# Patient Record
Sex: Female | Born: 1990 | Race: White | Hispanic: No | Marital: Married | Smoking: Never smoker
Health system: Southern US, Community
[De-identification: ages and names within clinical notes are randomized; demographics above are authoritative.]

## PROBLEM LIST (undated history)

## (undated) HISTORY — PX: CHOLECYSTECTOMY: SHX55

## (undated) HISTORY — PX: OOPHORECTOMY: SHX86

---

## 2013-09-27 DIAGNOSIS — F411 Generalized anxiety disorder: Secondary | ICD-10-CM | POA: Insufficient documentation

## 2015-03-08 ENCOUNTER — Ambulatory Visit: Payer: BLUE CROSS/BLUE SHIELD | Attending: Family Medicine | Admitting: Family Medicine

## 2015-03-08 ENCOUNTER — Encounter: Payer: Self-pay | Admitting: Family Medicine

## 2015-03-08 VITALS — BP 126/86 | HR 88 | Temp 98.2°F | Resp 20 | Ht 65.0 in | Wt 219.4 lb

## 2015-03-08 DIAGNOSIS — M25561 Pain in right knee: Secondary | ICD-10-CM | POA: Diagnosis present

## 2015-03-08 MED ORDER — TRAMADOL HCL 50 MG PO TABS
50.0000 mg | ORAL_TABLET | Freq: Three times a day (TID) | ORAL | Status: DC | PRN
Start: 2015-03-08 — End: 2015-04-09

## 2015-03-08 NOTE — Progress Notes (Signed)
Patient here for FU Knee pain to obtain MRI Referral  Patient complains of right knee pain scaled at an 8 currently. Patient states pain is constant and she has received no relief taking Tramadol and Meloxicam. Patient took a Vicodin she had from childbirth which gave her relief. Patient complains of right knee buckling when she walks. Patient had a fall on 03/03/15 which caused the initial pain. Patient states pain is now unbearable.   Patient would not like her flu shot today.

## 2015-03-08 NOTE — Patient Instructions (Addendum)
It was a pleasure to see you today.   As we discussed, I am referring you for evaluation by Sports Medicine for your right knee pain.    I am prescribing Tramadol 50mg  tablets, take 1 tablet every 8 hours as needed for pain.   Release of Information for X-ray reports/ED visits at [facility] and [facility].

## 2015-03-08 NOTE — Progress Notes (Addendum)
   Subjective:    Patient ID: Abigail Simpson, female    DOB: 1990-12-27, [age] y.o.   MRN: [id number]  HPI First visit in our practice, which is listed as her PCP on Medicaid card. History of knee pain/problems, R knee got worse 2 weeks ago when she presented to ED at [facility], had Xray and told she would likely need MRI.  Has had trouble ambulating since then, wearing a brace on R knee that she cannot wear while driving.   On 12/24 her dog ran into her accidentally and knocked her to floor, R knee buckled inward and was very painful.  She was on the floor and couldn't get up.  EMS was called. Family brought her to [facility], had Xrays and told again she would need orthopedic referral to be done by her primary provider.   ROS: No fevers or chills, no systemic illnesses. Never been diagnosed with HTN before, did have some PIH that resulted in induced delivery of her now-1169-month old daughter.  Review of Systems     Objective:   Physical Exam Alert, no distress.  Neck supple.  MSK: Knees without effusion or erythema or warmth. R knee with some crepitus with passive ROM testing. Pain to full flexion and full extension. Joint line tenderness along medial aspect of R knee.  Negative drawer sign testing R knee. Negative McMurray testing of right knee.  Bilateral palpable dp pulses. Sensation in both feet grossly normal and symmetric.       Assessment & Plan:  Right medial knee pain-- concern for possible meniscal or ACL injury. Referral for evaluation by Sports Medicine.    Prescription for tramadol 50mg  tablets,  1 tablet every 8 hours as needed for pain.   Release of Information for X-ray reports/ED visits at [facility] and [facility].  Follow up in this office after [facility] visit.

## 2015-03-14 ENCOUNTER — Encounter: Payer: Self-pay | Admitting: Family Medicine

## 2015-03-14 ENCOUNTER — Telehealth: Payer: Self-pay | Admitting: Family Medicine

## 2015-03-14 ENCOUNTER — Ambulatory Visit (INDEPENDENT_AMBULATORY_CARE_PROVIDER_SITE_OTHER): Payer: BLUE CROSS/BLUE SHIELD | Admitting: Family Medicine

## 2015-03-14 VITALS — BP 125/82 | HR 81 | Ht 65.0 in | Wt 210.0 lb

## 2015-03-14 DIAGNOSIS — M25561 Pain in right knee: Secondary | ICD-10-CM | POA: Diagnosis not present

## 2015-03-14 NOTE — Telephone Encounter (Signed)
Pt. Needs referral for MRI

## 2015-03-14 NOTE — Patient Instructions (Signed)
I'm concerned you tore your medial meniscus though it's possible you subluxed (near dislocated your patella). Wear knee brace for support. Icing 15 minutes at a time 3-4 times a day. Given your persistent problems I would recommend doing an MRI. Ibuprofen 600mg  three times a day with food OR aleve 2 tabs twice a day with food for pain and inflammation. Elevate above your heart when possible.

## 2015-03-15 ENCOUNTER — Telehealth: Payer: Self-pay | Admitting: Family Medicine

## 2015-03-15 NOTE — Assessment & Plan Note (Signed)
concerning for medial meniscus tear based on exam.  Brief MSK u/s of the knee shows visualized anterior meniscus is normal but most tears occur in the posterior meniscus that cannot be visualized by MSK u/s.  Effusion confirmed as well.  Recommended going ahead with MRI to further assess.  In meantime knee brace, icing, elevation, nsaids.

## 2015-03-15 NOTE — Addendum Note (Signed)
Addended by: Barbaraann BarthelBREEN, Addalynne Golding O on: 03/15/2015 02:06 PM   Modules accepted: Orders

## 2015-03-15 NOTE — Progress Notes (Signed)
PCP: [facility] Referred by Dr. Mauricio PoBreen.  Subjective:   HPI: Patient is a 25 y.o. female here for right knee injury.  Patient reports having problems with knees dating back to when she was a child - reports they were 'crooked and painful'  Then recently over past month she has had increased pain anterior right knee. On 12/24 her dog slid and struck her lateral aspect of right knee causing knee to buckle inwards. Pain level 7/10, sharp. Had radiographs negative for acute bony abnormalities [facility]. Patient endorses she was told the knee was dislocated but I don't see patellar dislocation on imaging and do not have access to her notes from that visit. Having popping with extension. Difficulty bearing weight. Tried brace, tramadol, meloxicam. No skin changes, fever. + swelling.  No past medical history on file.  Current Outpatient Prescriptions on File Prior to Visit  Medication Sig Dispense Refill  . ALPRAZolam (XANAX) 1 MG tablet Take 1 mg by mouth at bedtime as needed for anxiety.    . meloxicam (MOBIC) 15 MG tablet Take 15 mg by mouth daily.    . traMADol (ULTRAM) 50 MG tablet Take 1 tablet (50 mg total) by mouth every 8 (eight) hours as needed. 30 tablet 0   No current facility-administered medications on file prior to visit.    No past surgical history on file.  No Known Allergies  Social History   Social History  . Marital Status: Significant Other    Spouse Name: N/A  . Number of Children: N/A  . Years of Education: N/A   Occupational History  . Not on file.   Social History Main Topics  . Smoking status: Never Smoker   . Smokeless tobacco: Not on file  . Alcohol Use: No  . Drug Use: No  . Sexual Activity: Not on file   Other Topics Concern  . Not on file   Social History Narrative    No family history on file.  BP 125/82 mmHg  Pulse 81  Ht 5\' 5"  (1.651 m)  Wt 210 lb (95.255 kg)  BMI 34.95 kg/m2  Review of  Systems: See HPI above.    Objective:  Physical Exam:  Gen: NAD  Right knee: No gross deformity, ecchymoses.  Mild effusion confirmed by ultrasound TTP medial joint line. FROM. Negative ant/post drawers. Negative valgus/varus testing. Negative lachmanns. Positive mcmurrays, apleys, thessalys.  Pain with patellar apprehension. NV intact distally.  Left knee: FROM without pain currently.    Assessment & Plan:  1. Right knee injury - concerning for medial meniscus tear based on exam.  Brief MSK u/s of the knee shows visualized anterior meniscus is normal but most tears occur in the posterior meniscus that cannot be visualized by MSK u/s.  Effusion confirmed as well.  Recommended going ahead with MRI to further assess.  In meantime knee brace, icing, elevation, nsaids.

## 2015-03-27 ENCOUNTER — Ambulatory Visit (HOSPITAL_COMMUNITY)
Admission: RE | Admit: 2015-03-27 | Discharge: 2015-03-27 | Disposition: A | Discharge status: Home / Self Care | Payer: BLUE CROSS/BLUE SHIELD | Source: Ambulatory Visit | Attending: Family Medicine | Admitting: Family Medicine

## 2015-03-27 DIAGNOSIS — M238X1 Other internal derangements of right knee: Secondary | ICD-10-CM | POA: Diagnosis not present

## 2015-03-27 DIAGNOSIS — M25561 Pain in right knee: Secondary | ICD-10-CM | POA: Diagnosis not present

## 2015-03-27 DIAGNOSIS — M2241 Chondromalacia patellae, right knee: Secondary | ICD-10-CM | POA: Insufficient documentation

## 2015-03-28 ENCOUNTER — Telehealth: Payer: Self-pay

## 2015-03-28 NOTE — Telephone Encounter (Signed)
-----   Message from Barbaraann Barthel, MD sent at 03/28/2015  9:12 AM EST ----- Please notify patient that no ligament or mensical tears, no fractures seen on MRI.  "Bone bruising" and transient dislocation of patella.  Return to Sports Medicine for further evaluation and management if continuing with pain/problems.  Paula Compton, MD

## 2015-03-28 NOTE — Telephone Encounter (Signed)
Nurse called patient, patient verified date of birth. Patient aware of no ligament or meniscal tears, no fractures seen in MRI. Patient aware of "bone bruising" and transient dislocation of patella. Patient agrees to return to sports medicine for futher evaluation and management if continuing with problems and pain. Patient reports knee easily gives out and "brings me to my butt". Patient will call sports medicine for appointment.

## 2015-03-28 NOTE — Telephone Encounter (Signed)
Nurse called patient, reached voicemail. Left message for patient to call Matilde Pottenger with [facility], at [phone]. Nurse called to notify patient of no ligament or meniscal tears, no fractures seen on MRI. "Bone bruising" and transient dislocation of patella.  Return to Sports Medicine for further evaluation and management if continuing with pain/problems.

## 2015-04-09 ENCOUNTER — Ambulatory Visit (INDEPENDENT_AMBULATORY_CARE_PROVIDER_SITE_OTHER): Payer: BLUE CROSS/BLUE SHIELD | Admitting: Family Medicine

## 2015-04-09 ENCOUNTER — Ambulatory Visit (HOSPITAL_BASED_OUTPATIENT_CLINIC_OR_DEPARTMENT_OTHER)
Admission: RE | Admit: 2015-04-09 | Discharge: 2015-04-09 | Disposition: A | Discharge status: Home / Self Care | Payer: BLUE CROSS/BLUE SHIELD | Source: Ambulatory Visit | Attending: Family Medicine | Admitting: Family Medicine

## 2015-04-09 ENCOUNTER — Encounter: Payer: Self-pay | Admitting: Family Medicine

## 2015-04-09 VITALS — BP 132/89 | HR 91 | Ht 65.0 in | Wt 210.0 lb

## 2015-04-09 DIAGNOSIS — M25561 Pain in right knee: Secondary | ICD-10-CM

## 2015-04-09 DIAGNOSIS — M25461 Effusion, right knee: Secondary | ICD-10-CM | POA: Diagnosis not present

## 2015-04-09 MED ORDER — HYDROCODONE-ACETAMINOPHEN 7.5-325 MG PO TABS: 1.0000 | ORAL_TABLET | Freq: Four times a day (QID) | ORAL | Status: DC | PRN

## 2015-04-09 NOTE — Patient Instructions (Signed)
You suffered a patellar dislocation. Wear your immobilizer at all times when up and walking around - I would wear it to sleep as well as a precaution. Crutches to help get around. Icing 15 minutes at a time 3-4 times a day. Aleve 2 tabs twice a day with food for pain and inflammation. Norco as needed for severe pain (no driving on this medicine). Follow up with me in 2 weeks for reevaluation.

## 2015-04-11 NOTE — Assessment & Plan Note (Signed)
Independently reviewed todays radiograph and no avulsion, OCDs noted.  MRI from 1/17 prior to this injury consistent with patellar dislocation though MPFL was intact.  Has anatomy that predisposes her to subluxations and dislocations unfortunately.  Will start with conservative treatment - immobilizer, icing, nsaids with norco as needed.  F/u in 2 weeks for reevaluation.  Consider physical therapy at follow up and transition to shield's brace.

## 2015-04-11 NOTE — Progress Notes (Signed)
PCP: [facility] Referred by Dr. Mauricio Po.  Subjective:   HPI: Patient is a 25 y.o. female here for right knee injury.  1/4: Patient reports having problems with knees dating back to when she was a child - reports they were 'crooked and painful'  Then recently over past month she has had increased pain anterior right knee. On 12/24 her dog slid and struck her lateral aspect of right knee causing knee to buckle inwards. Pain level 7/10, sharp. Had radiographs negative for acute bony abnormalities at [facility]. Patient endorses she was told the knee was dislocated but I don't see patellar dislocation on imaging and do not have access to her notes from that visit. Having popping with extension. Difficulty bearing weight. Tried brace, tramadol, meloxicam. No skin changes, fever. + swelling.  1/30: Patient reports today she was doing a lunge and felt her right kneecap pop out of place laterally again. Extended and went back into place. Severe pain, swelling. Hasn't been wearing knee brace regularly. Has been taking tramadol as needed. Pain level 7/10 sharp and anterior. No skin changes, fever, other complaints.  No past medical history on file.  Current Outpatient Prescriptions on File Prior to Visit  Medication Sig Dispense Refill  . ALPRAZolam (XANAX) 1 MG tablet Take 1 mg by mouth at bedtime as needed for anxiety.     No current facility-administered medications on file prior to visit.    No past surgical history on file.  No Known Allergies  Social History   Social History  . Marital Status: Significant Other    Spouse Name: N/A  . Number of Children: N/A  . Years of Education: N/A   Occupational History  . Not on file.   Social History Main Topics  . Smoking status: Never Smoker   . Smokeless tobacco: Not on file  . Alcohol Use: No  . Drug Use: No  . Sexual Activity: Not on file   Other Topics Concern  . Not on file   Social  History Narrative    No family history on file.  BP 132/89 mmHg  Pulse 91  Ht  (1.651 m)  Wt 210 lb (95.255 kg)  BMI 34.95 kg/m2  LMP 03/26/2015 (Exact Date)  Review of Systems: See HPI above.    Objective:  Physical Exam:  Gen: NAD, uncomfortable.  Right knee: Mod effusion.  No bruising, other deformity. Diffuse anterior tenderness to palpation. ROM limited 0 - 80 degrees. Negative valgus/varus testing. Pain with patellar apprehension. NV intact distally.  Left knee: FROM without pain currently.    Assessment & Plan:  1. Right knee injury - Independently reviewed todays radiograph and no avulsion, OCDs noted.  MRI from 1/17 prior to this injury consistent with patellar dislocation though MPFL was intact.  Has anatomy that predisposes her to subluxations and dislocations unfortunately.  Will start with conservative treatment - immobilizer, icing, nsaids with norco as needed.  F/u in 2 weeks for reevaluation.  Consider physical therapy at follow up and transition to shield's brace.

## 2015-04-25 ENCOUNTER — Ambulatory Visit: Payer: BLUE CROSS/BLUE SHIELD | Admitting: Family Medicine

## 2015-04-25 ENCOUNTER — Encounter: Payer: Self-pay | Admitting: Family Medicine

## 2015-04-25 ENCOUNTER — Ambulatory Visit (INDEPENDENT_AMBULATORY_CARE_PROVIDER_SITE_OTHER): Payer: BLUE CROSS/BLUE SHIELD | Admitting: Family Medicine

## 2015-04-25 VITALS — BP 124/86 | HR 90 | Ht 65.0 in | Wt 200.0 lb

## 2015-04-25 DIAGNOSIS — M25561 Pain in right knee: Secondary | ICD-10-CM | POA: Diagnosis not present

## 2015-04-25 MED ORDER — TRAMADOL HCL 50 MG PO TABS: 50.0000 mg | ORAL_TABLET | Freq: Four times a day (QID) | ORAL | Status: AC | PRN

## 2015-04-25 NOTE — Patient Instructions (Addendum)
You suffered a patellar dislocation. Wear knee brace when up and walking around for next 4 weeks. Start physical therapy and do home exercises on days you don't go to therapy. Icing 15 minutes at a time 3-4 times a day. Aleve 2 tabs twice a day with food for pain and inflammation. Tramadol as needed for severe pain (no driving on this medicine). Follow up with me in 4 weeks for reevaluation.

## 2015-04-26 NOTE — Progress Notes (Signed)
PCP: [facility] Referred by Dr. Mauricio Po.  Subjective:   HPI: Patient is a 25 y.o. female here for right knee injury.  1/4: Patient reports having problems with knees dating back to when she was a child - reports they were 'crooked and painful'  Then recently over past month she has had increased pain anterior right knee. On 12/24 her dog slid and struck her lateral aspect of right knee causing knee to buckle inwards. Pain level 7/10, sharp. Had radiographs negative for acute bony abnormalities at [facility]. Patient endorses she was told the knee was dislocated but I don't see patellar dislocation on imaging and do not have access to her notes from that visit. Having popping with extension. Difficulty bearing weight. Tried brace, tramadol, meloxicam. No skin changes, fever. + swelling.  1/30: Patient reports today she was doing a lunge and felt her right kneecap pop out of place laterally again. Extended and went back into place. Severe pain, swelling. Hasn't been wearing knee brace regularly. Has been taking tramadol as needed. Pain level 7/10 sharp and anterior. No skin changes, fever, other complaints.  2/15: Patient reports her right knee pain is down to 4/10 level, sharp and anterior. Wearing brace all the time now. Some benefit with pain medicine. Swelling improved. No skin changes, fever.  No past medical history on file.  Current Outpatient Prescriptions on File Prior to Visit  Medication Sig Dispense Refill  . ALPRAZolam (XANAX) 1 MG tablet Take 1 mg by mouth at bedtime as needed for anxiety.     No current facility-administered medications on file prior to visit.    No past surgical history on file.  No Known Allergies  Social History   Social History  . Marital Status: Significant Other    Spouse Name: N/A  . Number of Children: N/A  . Years of Education: N/A   Occupational History  . Not on file.   Social History Main  Topics  . Smoking status: Never Smoker   . Smokeless tobacco: Not on file  . Alcohol Use: No  . Drug Use: No  . Sexual Activity: Not on file   Other Topics Concern  . Not on file   Social History Narrative    No family history on file.  BP 124/86 mmHg  Pulse 90  Ht  (1.651 m)  Wt 200 lb (90.719 kg)  BMI 33.28 kg/m2  PF 4 L/min  LMP 03/26/2015 (Exact Date)  Review of Systems: See HPI above.    Objective:  Physical Exam:  Gen: NAD, comfortable in exam room.  Right knee: Mild effusion.  No bruising, other deformity. Diffuse anterior tenderness to palpation. ROM 0 - 100 degrees. Negative valgus/varus testing. Pain with patellar apprehension. NV intact distally.  Left knee: FROM without pain currently.    Assessment & Plan:  1. Right patellar dislocation - MRI from 1/17 prior to this new injury from 2 weeks ago consistent with patellar dislocation though MPFL was intact.  Has anatomy that predisposes her to subluxations and dislocations unfortunately.  Has switched from immobilizer to knee brace.  Icing, nsaids with tramadol as needed.  Start physical therapy and home exercises, f/u in 4 weeks.

## 2015-04-26 NOTE — Assessment & Plan Note (Signed)
Right patellar dislocation - MRI from 1/17 prior to this new injury from 2 weeks ago consistent with patellar dislocation though MPFL was intact.  Has anatomy that predisposes her to subluxations and dislocations unfortunately.  Has switched from immobilizer to knee brace.  Icing, nsaids with tramadol as needed.  Start physical therapy and home exercises, f/u in 4 weeks.

## 2015-04-30 ENCOUNTER — Other Ambulatory Visit: Payer: Self-pay | Admitting: *Deleted

## 2015-04-30 ENCOUNTER — Telehealth: Payer: Self-pay | Admitting: Family Medicine

## 2015-04-30 MED ORDER — MELOXICAM 15 MG PO TABS: 15.0000 mg | ORAL_TABLET | Freq: Every day | ORAL | Status: AC

## 2015-04-30 NOTE — Telephone Encounter (Signed)
Spoke to patient and gave her information provided by physician. Patient wanted a prescription anti-inflammatory. Called in Meloxicam.

## 2015-04-30 NOTE — Telephone Encounter (Signed)
As we discussed would not refill the norco.  In addition to the tramadol she should be taking tylenol and an anti-inflammatory (aleve 2 tabs twice a day with food or ibuprofen  three times a day) - I can call in a prescription anti-inflammatory instead if she would like.

## 2015-05-09 ENCOUNTER — Ambulatory Visit
Payer: BLUE CROSS/BLUE SHIELD | Attending: Family Medicine | Admitting: Rehabilitative and Restorative Service Providers"

## 2015-05-09 ENCOUNTER — Encounter: Payer: Self-pay | Admitting: Rehabilitative and Restorative Service Providers"

## 2015-05-09 DIAGNOSIS — M25562 Pain in left knee: Secondary | ICD-10-CM | POA: Diagnosis present

## 2015-05-09 DIAGNOSIS — Z7409 Other reduced mobility: Secondary | ICD-10-CM | POA: Insufficient documentation

## 2015-05-09 DIAGNOSIS — R531 Weakness: Secondary | ICD-10-CM

## 2015-05-09 DIAGNOSIS — M25561 Pain in right knee: Secondary | ICD-10-CM | POA: Insufficient documentation

## 2015-05-09 DIAGNOSIS — R269 Unspecified abnormalities of gait and mobility: Secondary | ICD-10-CM

## 2015-05-09 DIAGNOSIS — M623 Immobility syndrome (paraplegic): Secondary | ICD-10-CM | POA: Diagnosis present

## 2015-05-09 DIAGNOSIS — M2560 Stiffness of unspecified joint, not elsewhere classified: Secondary | ICD-10-CM

## 2015-05-09 NOTE — Therapy (Signed)
[facility] Outpatient Rehabilitation [facility] [address]  [address] [place], [place], [address] Phone: [phone]   Fax:  [phone]  Physical Therapy Evaluation  Patient Details  Name: Abigail Simpson MRN: [id number] Date of Birth: November 26, 1990 Referring Provider: Dr. Norton Blizzard  Encounter Date: 05/09/2015      PT End of Session - 05/09/15 1356    Visit Number 1   Number of Visits 12   Date for PT Re-Evaluation 06/20/15   PT Start Time 1356   PT Stop Time 1500   PT Time Calculation (min) 64 min   Activity Tolerance Patient tolerated treatment well      History reviewed. No pertinent past medical history.  History reviewed. No pertinent past surgical history.  There were no vitals filed for this visit.  Visit Diagnosis:  Knee pain, acute, right - Plan: PT plan of care cert/re-cert  Knee pain, acute, left - Plan: PT plan of care cert/re-cert  Abnormal gait - Plan: PT plan of care cert/re-cert  Stiffness due to immobility - Plan: PT plan of care cert/re-cert  Decreased strength, endurance, and mobility - Plan: PT plan of care cert/re-cert      Subjective Assessment - 05/09/15 1356    Subjective Princessa reports that she dislocated the Rt knee cap 03/03/15 and then again 03/30/15. She feels weak and unstable without her brace. She removed the brace today and states that she feels the knee brace is "digging in to the leg" now.    Pertinent History denies any medical problems   How long can you sit comfortably? siting with knee bent and feet unsupported ~5 min    How long can you stand comfortably? 2 min    How long can you walk comfortably? 7 min    Diagnostic tests xrays (-)    Patient Stated Goals strengthen knees    Currently in Pain? Yes   Pain Score 3    Pain Location Knee   Pain Orientation Right   Pain Descriptors / Indicators Burning   Pain Type Acute pain   Pain Onset More than a month ago   Pain Frequency Intermittent   Aggravating  Factors  prolonged sitting, standing, walking; turning knee any way; sudden movements; squatting    Pain Relieving Factors avoiding activities that irritate symptoms; ice and heat made knee "almost feel worse"             [facility] PT Assessment - 05/09/15 0001    Assessment   Medical Diagnosis Rt knee pain    Referring Provider Dr. Vincenza Hews hudnall   Onset Date/Surgical Date 03/03/15   Hand Dominance Right   Next MD Visit PRN    Prior Therapy none   Precautions   Precautions None   Balance Screen   Has the patient fallen in the past 6 months Yes   How many times? 2   Has the patient had a decrease in activity level because of a fear of falling?  No   Is the patient reluctant to leave their home because of a fear of falling?  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    Leisure child care; household chores; Pharmacologist; cooking   Observation/Other Assessments   Focus on Therapeutic  Outcomes (FOTO)  53% limitation    Sensation   Additional Comments WFL's per pt report    Posture/Postural Control   Posture Comments Head forward; shouders rounded and elevated; increased thoracic kyphosis; wt shifted to the Lt; Rt knee slightly bent; Lt knee hyperextended    AROM   Right/Left Hip --  WFL's Lt - some end range tightness Rt rotation    Right Knee Extension -9   Right Knee Flexion 123   Left Knee Extension 6   Left Knee Flexion 131   Right/Left Ankle --  WFL's   Strength   Right Hip Flexion --  5-/5   Right Hip Extension 4+/5   Right Hip ABduction --  5-/5   Right Hip ADduction 4+/5   Right Knee Flexion 4+/5   Right Knee Extension 4+/5   Left Knee Flexion --  5-/5   Left Knee Extension 5/5   Right/Left Ankle --  WFL's   Flexibility   Hamstrings tight Rt at 75 deg with limitatioin  noted in full knee extension; HS tightness Lt at ~ 80 deg    Quadriceps heel to buttock in prone Rt ~3.5 inches; Lt 2 inches   ITB mild tightness Rt > Lt    Palpation   Patella mobility Rt patella laterally oriented    Palpation comment tightness through lateral quad just proximal to the patella; tender to palpation distal medial patellar area    Ambulation/Gait   Gait Comments ambulates with decreased wt bearing and wt shift to Rt; Rt LE ER in standing and walking                    [facility] Adult PT Treatment/Exercise - 05/09/15 0001    Neuro Re-ed    Neuro Re-ed Details  standing with knees straight; avoiding hyperextension on Lt and excessive flexion on the Rt   Knee/Hip Exercises: Stretches   Passive Hamstring Stretch 3 reps;30 seconds   Knee/Hip Exercises: Supine   Quad Sets Right;Left;1 set;10 reps  10 sec hold    Hip Adduction Isometric 1 set;10 reps  10 sec hold ball between knees    Straight Leg Raise with External Rotation Right;1 set;10 reps  10-12 inch lift; 5 sec hold    Manual Therapy   Soft tissue mobilization deep tissue work lateral quad and lateral to medial stretch for Rt patella   [employer];Inhibit Muscle;Facilitate Muscle  Rt patella realignmentcorrecting lat position; Lt supportive   [employer]   Barista on Rt    Inhibit Muscle  inhibit lateral quad on Rt    Facilitate Muscle  facilitate quads Lt knee                 PT Education - 05/09/15 1455    Education provided Yes   Education Details HEP; care for tape - to remove tape if there is any irritation or problems   Person(s) Educated Patient   Methods Explanation;Demonstration;Tactile cues;Verbal cues;Handout   Comprehension Verbalized understanding;Returned demonstration;Verbal cues required;Tactile cues required             PT Long Term Goals - 05/09/15 1517    PT LONG TERM GOAL #1   Title Improve posture and alignment with pt to demo  good standing posture without knees hyperextended or flexed 06/20/15   Time 6   Period Weeks   Status New   PT LONG TERM GOAL #2   Title improve gait pattern and tolerance with pt to  ambulate without limp for 20-30 min with minimal to no pain 06/20/15   Time 6   Period Weeks   Status New   PT LONG TERM GOAL #3   Title 5/5 strength bilat LE's to allow pt to perform functional tasks without limitations 06/20/15   Time 6   Period Weeks   Status New   PT LONG TERM GOAL #4   Title patient will be able to ascend and descend stairs step over step in home without difficulty or rfear of falling 06/20/15   Time 6   Period Weeks   Status New   PT LONG TERM GOAL #5   Title I in HEP to prevent recurrent problems with knee instability 06/20/15   Time 6   Period Weeks   Status New   PT LONG TERM GOAL #6   Title Improve FOTO to </= 35% limitation 06/20/15   Time 6   Period Weeks   Status New               Plan - 05/09/15 1512    Clinical Impression Statement Patient presents with bilat knee instability and pain Rt >> Lt with two incidents of subluxation of the Rt patella. She has been in a knee brace for 2+ months stating that she took the brace off today because it seems to be irritating the symptoms at this time. Clinically pt has abnormal posture andalignment; abnormal and antalgic gait; limited Rt knee ROM compared to Lt; weakness bilat LE's; fear of falling; inability to perform normal functional activities due to above. Pt will benefit form PT to address problems as identified.    Pt will benefit from skilled therapeutic intervention in order to improve on the following deficits Postural dysfunction;Improper body mechanics;Decreased range of motion;Decreased strength;Abnormal gait;Pain;Decreased endurance;Decreased activity tolerance   Rehab Potential Good   PT Frequency 2x / week   PT Duration 6 weeks   PT Treatment/Interventions Patient/family education;ADLs/Self Care Home  Management;Neuromuscular re-education;Therapeutic exercise;Therapeutic activities;Manual techniques;Dry needling;Cryotherapy;Electrical Stimulation;Moist Heat;Ultrasound   PT Next Visit Plan assess response to taping and initial exercise; progress with strengthening bilat LE's - hips/knees/ankles - progressing to stabilization and functional activities; modalities as indicated   PT Home Exercise Plan HEP; taping bilat knees    Consulted and Agree with Plan of Care Patient         Problem List Patient Active Problem List   Diagnosis Date Noted  . Right medial knee pain 03/08/2015  . Anxiety, generalized 09/27/2013    Roseline Ebarb Rober Minion PT, MPH  05/09/2015, 3:28 PM  [facility] [address]  [address] [place], [place], [address] Phone: [phone]   Fax:  [phone]  Name: Matylda Fehring MRN: [id number] Date of Birth: May 17, 1990

## 2015-05-09 NOTE — Patient Instructions (Signed)
HIP: Hamstrings - Supine    Place strap around foot. Keep knee very straight. Raise leg up, keep knee straight. Hold _30_ seconds. _3__ reps per set, __2-3_ times per day  Quad Set: Slight Flexion    Tense muscles on top of left thigh. Hold __10__ seconds. Repeat __10__ times per set. Do __1-3__ sets per session. Do __2-3__ sessions per day.   Straight Leg: with Bent Knee (Supine)    With right leg straight; turn toes out about 45 degrees, left leg bent, tighten quads and raise straight leg __10-12__ inches. Repeat _10___ times per set. Do _1-3___ sets per session. Do __1-2__ sessions per day.   Strengthening: Hip Adduction - Isometric    With ball or folded pillow between knees, squeeze knees together. Hold __10__ seconds. Repeat __10__ times per set. Do __1-3__ sets per session. Do __1=2__ sessions per day.   Self-Mobilization: Inward Kneecap Push    With entire length of index finger along outer border of right kneecap, gently push kneecap in toward other leg. Hold 20-30____ seconds. Repeat __2-3__ times per set. Do __2-3__ sessions per day.       Marland Kitchen

## 2015-05-16 ENCOUNTER — Ambulatory Visit: Payer: BLUE CROSS/BLUE SHIELD

## 2015-05-16 DIAGNOSIS — M25561 Pain in right knee: Secondary | ICD-10-CM | POA: Diagnosis not present

## 2015-05-16 DIAGNOSIS — M2560 Stiffness of unspecified joint, not elsewhere classified: Secondary | ICD-10-CM

## 2015-05-16 DIAGNOSIS — M25562 Pain in left knee: Secondary | ICD-10-CM

## 2015-05-16 DIAGNOSIS — R531 Weakness: Secondary | ICD-10-CM

## 2015-05-16 DIAGNOSIS — Z7409 Other reduced mobility: Secondary | ICD-10-CM

## 2015-05-16 DIAGNOSIS — R6889 Other general symptoms and signs: Secondary | ICD-10-CM

## 2015-05-16 DIAGNOSIS — R269 Unspecified abnormalities of gait and mobility: Secondary | ICD-10-CM

## 2015-05-16 NOTE — Therapy (Signed)
[facility] Outpatient Rehabilitation [facility] [address]  [address] [place], [place], [address] Phone: [phone]   Fax:  [phone]  Physical Therapy Treatment  Patient Details  Name: Abigail Simpson MRN: [id number] Date of Birth: 09-20-1990 Referring Provider: Dr. Norton Blizzard  Encounter Date: 05/16/2015      PT End of Session - 05/16/15 1532    Visit Number 2   Number of Visits 12   Date for PT Re-Evaluation 06/20/15   PT Start Time 1532   PT Stop Time 1615   PT Time Calculation (min) 43 min   Activity Tolerance Patient tolerated treatment well   Behavior During Therapy [facility] for tasks assessed/performed      History reviewed. No pertinent past medical history.  History reviewed. No pertinent past surgical history.  There were no vitals filed for this visit.  Visit Diagnosis:  Knee pain, acute, right  Knee pain, acute, left  Abnormal gait  Stiffness due to immobility  Decreased strength, endurance, and mobility      Subjective Assessment - 05/16/15 1532    Subjective Pt. reports 2/10 R knee pain currenly.  No pain or other complaints reported.  Pt. reports taping helped from last treatment.     Pertinent History denies any medical problems   How long can you sit comfortably? siting with knee bent and feet unsupported ~5 min    How long can you stand comfortably? 2 min    How long can you walk comfortably? 7 min    Diagnostic tests xrays (-)    Patient Stated Goals strengthen knees    Currently in Pain? Yes   Pain Score 2    Pain Location Knee   Pain Orientation Right   Pain Descriptors / Indicators Burning   Pain Type Acute pain   Pain Onset More than a month ago   Pain Frequency Intermittent   Aggravating Factors  sudden movements; squatting   Pain Relieving Factors heat and ice make knee "feel worse".   Multiple Pain Sites No     Today's treatment:  Therex: Bridging with adduction squeeze 2 x 10 reps  Bridging with B hip  ER with green band x 10 reps Bridging with isometric / ER with green TB around knees 4 x 5 reps R HS stretch 2 x 30 sec  Straight leg raise with External rotation x 10 reps with 12" raise to 5 sec hold Straight leg raise with External rotation x 10 reps with 12" raise with #2 cuff weight on ankle   Manual: Lateral > medial mobilizations of R patella   [employer]: R lateral patella support: - I strip to lateral patella (30 % stretch) - I strip to medial patella (30 % stretch) - Cross strip super to patella (30% stretch) - Cross strip just inferior to patella (30% stretch)        PT Long Term Goals - 05/17/15 0815    PT LONG TERM GOAL #1   Title Improve posture and alignment with pt to demo good standing posture without knees hyperextended or flexed 06/20/15   Time 6   Period Weeks   Status On-going   PT LONG TERM GOAL #2   Title improve gait pattern and tolerance with pt to ambulate without limp for 20-30 min with minimal to no pain 06/20/15   Time 6   Period Weeks   Status On-going   PT LONG TERM GOAL #3   Title 5/5 strength bilat LE's to allow  pt to perform functional tasks without limitations 06/20/15   Time 6   Period Weeks   Status On-going   PT LONG TERM GOAL #4   Title patient will be able to ascend and descend stairs step over step in home without difficulty or rfear of falling 06/20/15   Time 6   Period Weeks   Status On-going   PT LONG TERM GOAL #5   Title I in HEP to prevent recurrent problems with knee instability 06/20/15   Time 6   Period Weeks   Status On-going   PT LONG TERM GOAL #6   Title Improve FOTO to </= 35% limitation 06/20/15   Time 6   Period Weeks   Status On-going          Plan - 05/16/15 1533    Clinical Impression Statement Pt. tolerated all R LE strengthening and stretching well with no increase in R knee pain.  Pt. reported taping from last treatment helped pain; taping reapplied to lateral patella at beginning of treatment today.  Pt.  reports adherence to all HEP activities with exception of patellar mobilizations.  Today's treatment focused on strengthening R VMO, B hips and improving R LE flexibiliy.     PT Next Visit Plan assess response to taping and initial exercise; progress with strengthening bilat LE's - hips/knees/ankles - progressing to stabilization and functional activities; modalities as indicated      Problem List Patient Active Problem List   Diagnosis Date Noted  . Right medial knee pain 03/08/2015  . Anxiety, generalized 09/27/2013    Kermit BaloMicah Jaymian Bogart, PTA 05/17/2015, 8:15 AM  [facility] [address]  [address] [place], [place], [address] Phone: [phone]   Fax:  [phone]  Name: Abigail Simpson MRN: [id number] Date of Birth: 1990/03/13

## 2015-05-23 ENCOUNTER — Ambulatory Visit: Payer: BLUE CROSS/BLUE SHIELD

## 2015-05-29 ENCOUNTER — Ambulatory Visit: Payer: BLUE CROSS/BLUE SHIELD | Admitting: Physical Therapy

## 2015-05-29 DIAGNOSIS — M25561 Pain in right knee: Secondary | ICD-10-CM

## 2015-05-29 DIAGNOSIS — M2560 Stiffness of unspecified joint, not elsewhere classified: Secondary | ICD-10-CM

## 2015-05-29 DIAGNOSIS — R6889 Other general symptoms and signs: Secondary | ICD-10-CM

## 2015-05-29 DIAGNOSIS — Z7409 Other reduced mobility: Secondary | ICD-10-CM

## 2015-05-29 DIAGNOSIS — R531 Weakness: Secondary | ICD-10-CM

## 2015-05-29 DIAGNOSIS — M25562 Pain in left knee: Secondary | ICD-10-CM

## 2015-05-29 DIAGNOSIS — R269 Unspecified abnormalities of gait and mobility: Secondary | ICD-10-CM

## 2015-05-29 NOTE — Telephone Encounter (Signed)
Finished

## 2015-05-29 NOTE — Therapy (Addendum)
[facility] [address]  [place], [place], [address] Phone: [phone]   Fax:  [phone]  Physical Therapy Treatment  Patient Details  Name: Abigail Simpson MRN: [id number] Date of Birth: Jul 08, 1990 Referring Provider: Dr. Karlton Lemon  Encounter Date: 05/29/2015      PT End of Session - 05/29/15 1324    Visit Number 3   Number of Visits 12   Date for PT Re-Evaluation 06/20/15   PT Start Time 6333   PT Stop Time 1400   PT Time Calculation (min) 45 min   Activity Tolerance Patient tolerated treatment well   Behavior During Therapy [facility] for tasks assessed/performed      No past medical history on file.  No past surgical history on file.  There were no vitals filed for this visit.  Visit Diagnosis:  Knee pain, acute, right  Knee pain, acute, left  Abnormal gait  Stiffness due to immobility  Decreased strength, endurance, and mobility      Subjective Assessment - 05/29/15 1322    Subjective Pt reports limited compliance with HEP but trying to get better about it recently. Notes pain increased a few days ago without known specific trigger but thinks it may have been that she was trying to do more.   Currently in Pain? Yes   Pain Score --  3-4/10   Pain Location Knee   Pain Orientation Right          Today's treatment  TherEx NuStep - L4 x 4' R HS stretch with strap 2 x 30" Bridge + B Hip ADD squeeze on small ball 10x5" Bridge + B Hip ABD isometric with blue TB 10x5" Sustained Bridge + Alternating B hip ABD/ER with blue TB 4x5 L side-lying R Hip ABD/ER Clam 10x3" Bridge + HS curl with feet on orange (55cm) Pball x10 B Alternating SL Bridge 10x3" R ITB stretch manual & with strap 2 x 30"   Manual R Medial patellar glide  Manual R ITB stretch with medial patellar glide  Kinesiotape: R lateral patella support (3 strips)   2 "I" strips to medial (30%) & lateral (50%) patella meeting at  tibial tuberosity and distal quad   1 "I" with 50% pull lateral to medial over inferior patella         PT Long Term Goals - 05/29/15 1451    PT LONG TERM GOAL #1   Title Improve posture and alignment with pt to demo good standing posture without knees hyperextended or flexed 06/20/15   Time 6   Period Weeks   Status On-going   PT LONG TERM GOAL #2   Title improve gait pattern and tolerance with pt to ambulate without limp for 20-30 min with minimal to no pain 06/20/15   Time 6   Period Weeks   Status On-going   PT LONG TERM GOAL #3   Title 5/5 strength bilat LE's to allow pt to perform functional tasks without limitations 06/20/15   Time 6   Period Weeks   Status On-going   PT LONG TERM GOAL #4   Title patient will be able to ascend and descend stairs step over step in home without difficulty or rfear of falling 06/20/15   Time 6   Period Weeks   Status On-going   PT LONG TERM GOAL #5   Title I in HEP to prevent recurrent problems with knee instability 06/20/15   Time 6   Period Weeks  Status On-going   PT LONG TERM GOAL #6   Title Improve FOTO to </= 35% limitation 06/20/15   Time 6   Period Weeks   Status On-going               Plan - 05/29/15 1448    Clinical Impression Statement Pt reporting limited compliance with HEP but trying to get on a more consistent schedule. Able to progress resistance and intensity of supine exercises today with increased muscle effort noted but no increased pain. Will plan to work toward more upright CKC activities at next visit.   PT Next Visit Plan Progress with strengthening bilat LE's - hips/knees/ankles - progressing to stabilization and functional activities; modalities as indicated   Consulted and Agree with Plan of Care Patient        Problem List Patient Active Problem List   Diagnosis Date Noted  . Right medial knee pain 03/08/2015  . Anxiety, generalized 09/27/2013    Percival Spanish, PT, MPT 05/29/2015, 2:52  PM  [facility] [address]  [place], [place], [address] Phone: [phone]   Fax:  [phone]  Name: Gavyn Zoss MRN: [id number] Date of Birth: 1990/04/16       PHYSICAL THERAPY DISCHARGE SUMMARY  Visits from Start of Care: 3  Current functional level related to goals / functional outcomes: See above   Remaining deficits: Unknown as pt did not return; pt did not show for last appt and did not return phone call to pt.   Education / Equipment: HEP  Plan: Patient agrees to discharge.  Patient goals were not met. Patient is being discharged due to not returning since the last visit.  ?????    Laureen Abrahams, PT, DPT 07/05/2015 1:33 PM  [facility] [place], [place] [address]  [phone] (office) [phone] (fax)

## 2015-05-30 ENCOUNTER — Ambulatory Visit: Payer: BLUE CROSS/BLUE SHIELD

## 2015-06-05 ENCOUNTER — Ambulatory Visit: Payer: BLUE CROSS/BLUE SHIELD

## 2015-06-26 ENCOUNTER — Other Ambulatory Visit: Payer: Self-pay | Admitting: Family Medicine

## 2015-09-16 ENCOUNTER — Emergency Department (HOSPITAL_BASED_OUTPATIENT_CLINIC_OR_DEPARTMENT_OTHER): Payer: BLUE CROSS/BLUE SHIELD

## 2015-09-16 ENCOUNTER — Encounter (HOSPITAL_BASED_OUTPATIENT_CLINIC_OR_DEPARTMENT_OTHER): Payer: Self-pay | Admitting: Emergency Medicine

## 2015-09-16 ENCOUNTER — Emergency Department (HOSPITAL_BASED_OUTPATIENT_CLINIC_OR_DEPARTMENT_OTHER)
Admission: EM | Admit: 2015-09-16 | Discharge: 2015-09-16 | Disposition: A | Discharge status: Home / Self Care | Payer: BLUE CROSS/BLUE SHIELD | Attending: Emergency Medicine | Admitting: Emergency Medicine

## 2015-09-16 DIAGNOSIS — N939 Abnormal uterine and vaginal bleeding, unspecified: Secondary | ICD-10-CM | POA: Insufficient documentation

## 2015-09-16 DIAGNOSIS — R1032 Left lower quadrant pain: Secondary | ICD-10-CM

## 2015-09-16 DIAGNOSIS — R102 Pelvic and perineal pain: Secondary | ICD-10-CM

## 2015-09-16 LAB — CBC WITH DIFFERENTIAL/PLATELET
Basophils Absolute: 0 10*3/uL (ref 0.0–0.1)
Basophils Relative: 0 %
EOS ABS: 0.1 10*3/uL (ref 0.0–0.7)
Eosinophils Relative: 1 %
HCT: 43.4 % (ref 36.0–46.0)
HEMOGLOBIN: 15 g/dL (ref 12.0–15.0)
LYMPHS ABS: 2.3 10*3/uL (ref 0.7–4.0)
LYMPHS PCT: 19 %
MCH: 30.7 pg (ref 26.0–34.0)
MCHC: 34.6 g/dL (ref 30.0–36.0)
MCV: 88.9 fL (ref 78.0–100.0)
Monocytes Absolute: 0.6 10*3/uL (ref 0.1–1.0)
Monocytes Relative: 5 %
NEUTROS PCT: 75 %
Neutro Abs: 9.2 10*3/uL — ABNORMAL HIGH (ref 1.7–7.7)
Platelets: 243 10*3/uL (ref 150–400)
RBC: 4.88 MIL/uL (ref 3.87–5.11)
RDW: 14.2 % (ref 11.5–15.5)
WBC: 12.3 10*3/uL — AB (ref 4.0–10.5)

## 2015-09-16 LAB — PREGNANCY, URINE: Preg Test, Ur: NEGATIVE

## 2015-09-16 MED ORDER — HYDROCODONE-ACETAMINOPHEN 5-325 MG PO TABS: 1.0000 | ORAL_TABLET | ORAL | Status: AC | PRN

## 2015-09-16 MED ORDER — ALPRAZOLAM 0.5 MG PO TABS
0.5000 mg | ORAL_TABLET | Freq: Once | ORAL | Status: AC
  Administered 2015-09-16: 0.5 mg via ORAL
  Filled 2015-09-16: qty 1

## 2015-09-16 NOTE — ED Notes (Signed)
Pt in c/o L pelvic pain and vaginal bleeding onset 2 days ago, hx of R oophorectomy. Ambulatory in NAD.

## 2015-09-16 NOTE — ED Notes (Signed)
Pt states left lower "internal" quadrant pain, unchanged with palpation, and abnormal vaginal bleeding for past 4 days off and on.  Pt states she had intercourse on July 2 and bleeding started immediately after that.

## 2015-09-16 NOTE — ED Provider Notes (Signed)
CSN: [id number]     Arrival date & time 09/16/15  1432 History  By signing my name below, I, Doreatha Martin, attest that this documentation has been prepared under the direction and in the presence of Marily Memos, MD. Electronically Signed: Doreatha Martin, ED Scribe. 09/16/2015. 3:23 PM.    Chief Complaint  Patient presents with  . Pelvic Pain   The history is provided by the patient. No language interpreter was used.    HPI Comments: Abigail Simpson is a 25 y.o. female who presents to the Emergency Department complaining of moderate LLQ cramping onset yesterday with associated abnormal vaginal bleeding onset last night. Per pt, she has changed 3 tampons and the bleeding has progressively gotten heavier since onset. Pt states her bleeding has not contained any clots. She is not on oral contraceptives and has never been on oral contraceptives. Pt is not actively trying to conceive. LNMP ended on 09/08/15. Pt states she was referred to the ED after calling her OB/GYN last night. She states that she had unprotected intercourse on 09/09/15 before the onset of her symptoms and is concerned for pregnancy. Pt has h/o right oophorectomy d/t cyst. She denies fever, nausea, emesis, diarrhea, constipation, rash, pallor, vaginal discharge or odor.   History reviewed. No pertinent past medical history. Past Surgical History  Procedure Laterality Date  . Oophorectomy     History reviewed. No pertinent family history. Social History  Substance Use Topics  . Smoking status: Never Smoker   . Smokeless tobacco: None  . Alcohol Use: No   OB History    No data available     Review of Systems  Constitutional: Negative for fever.  Gastrointestinal: Positive for abdominal pain. Negative for nausea, vomiting, diarrhea and constipation.  Genitourinary: Positive for vaginal bleeding. Negative for vaginal discharge.  Skin: Negative for pallor and rash.  All other systems reviewed and are negative.  Allergies  Review of  patient's allergies indicates no known allergies.  Home Medications   Prior to Admission medications   Medication Sig Start Date End Date Taking? Authorizing Provider  ALPRAZolam Prudy Feeler) 1 MG tablet Take 1 mg by mouth at bedtime as needed for anxiety.    Historical Provider, MD  HYDROcodone-acetaminophen (NORCO/VICODIN) 5-325 MG tablet Take 1 tablet by mouth every 4 (four) hours as needed. 09/16/15   Marily Memos, MD  meloxicam (MOBIC) 15 MG tablet Take 1 tablet (15 mg total) by mouth daily. Patient not taking: Reported on 05/09/2015 04/30/15   Lenda Kelp, MD  traMADol (ULTRAM) 50 MG tablet Take 1 tablet (50 mg total) by mouth every 6 (six) hours as needed. 04/25/15   Lenda Kelp, MD   BP 125/88 mmHg  Pulse 80  Temp(Src) 98.8 F (37.1 C) (Oral)  Resp 20  Ht  (1.651 m)  Wt 180 lb (81.647 kg)  BMI 29.95 kg/m2  SpO2 100%  LMP 09/04/2015 Physical Exam  Constitutional: She appears well-developed and well-nourished.  HENT:  Head: Normocephalic.  Eyes: Conjunctivae are normal.  Cardiovascular: Normal rate.   Pulmonary/Chest: Effort normal. No respiratory distress.  Abdominal: Soft. She exhibits no distension and no mass. There is no tenderness. There is no rebound and no guarding.  Genitourinary:  Normal external female genitalia. Bright red blood in vaginal vault. No evidence of laceration. Small amount of oozing from os. On bimanual exam no adnexal tenderness or masses bilaterally. Chaperone present throughout entire exam.    Musculoskeletal: Normal range of motion.  Neurological: She is alert.  Skin: Skin is warm and dry.  Psychiatric: She has a normal mood and affect. Her behavior is normal.  Nursing note and vitals reviewed.   ED Course  Procedures (including critical care time) DIAGNOSTIC STUDIES: Oxygen Saturation is 100% on RA, normal by my interpretation.    COORDINATION OF CARE: 3:21 PM Discussed treatment plan with pt at bedside which includes lab work and pt  agreed to plan.   Labs Review Labs Reviewed  CBC WITH DIFFERENTIAL/PLATELET - Abnormal; Notable for the following:    WBC 12.3 (*)    Neutro Abs 9.2 (*)    All other components within normal limits  PREGNANCY, URINE    Imaging Review [place] Transvaginal Non-ob  09/16/2015  CLINICAL DATA:  Acute onset of left lower quadrant abdominal pain and vaginal bleeding. Initial encounter. EXAM: TRANSABDOMINAL AND TRANSVAGINAL ULTRASOUND OF PELVIS DOPPLER ULTRASOUND OF OVARIES TECHNIQUE: Both transabdominal and transvaginal ultrasound examinations of the pelvis were performed. Transabdominal technique was performed for global imaging of the pelvis including uterus, ovaries, adnexal regions, and pelvic cul-de-sac. It was necessary to proceed with endovaginal exam following the transabdominal exam to visualize the left ovary. Color and duplex Doppler ultrasound was utilized to evaluate blood flow to the ovaries. COMPARISON:  None. FINDINGS: Uterus Measurements: 9.4 x 4.2 x 5.9 cm. No fibroids or other mass visualized. A nabothian cyst is noted at the cervix. Endometrium Thickness: 0.3 cm.  No focal abnormality visualized. Right ovary Measurements: 2.7 x 2.1 x 2.0 cm. Normal appearance/no adnexal mass. Left ovary Measurements: 4.5 x 3.3 x 3.9 cm. Normal appearance/no adnexal mass. Pulsed Doppler evaluation of both ovaries demonstrates normal low-resistance arterial and venous waveforms. Other findings No abnormal Simpson fluid. IMPRESSION: 1. Unremarkable pelvic ultrasound.  No evidence for ovarian torsion. 2. The right ovary is still seen. The patient's prior surgery may have simply reflected removal of a right-sided ovarian cyst. Electronically Signed   By: Roanna RaiderJeffery  Chang M.D.   On: 09/16/2015 16:53   [place] Pelvis Complete  09/16/2015  CLINICAL DATA:  Acute onset of left lower quadrant abdominal pain and vaginal bleeding. Initial encounter. EXAM: TRANSABDOMINAL AND TRANSVAGINAL ULTRASOUND OF PELVIS DOPPLER ULTRASOUND OF  OVARIES TECHNIQUE: Both transabdominal and transvaginal ultrasound examinations of the pelvis were performed. Transabdominal technique was performed for global imaging of the pelvis including uterus, ovaries, adnexal regions, and pelvic cul-de-sac. It was necessary to proceed with endovaginal exam following the transabdominal exam to visualize the left ovary. Color and duplex Doppler ultrasound was utilized to evaluate blood flow to the ovaries. COMPARISON:  None. FINDINGS: Uterus Measurements: 9.4 x 4.2 x 5.9 cm. No fibroids or other mass visualized. A nabothian cyst is noted at the cervix. Endometrium Thickness: 0.3 cm.  No focal abnormality visualized. Right ovary Measurements: 2.7 x 2.1 x 2.0 cm. Normal appearance/no adnexal mass. Left ovary Measurements: 4.5 x 3.3 x 3.9 cm. Normal appearance/no adnexal mass. Pulsed Doppler evaluation of both ovaries demonstrates normal low-resistance arterial and venous waveforms. Other findings No abnormal Simpson fluid. IMPRESSION: 1. Unremarkable pelvic ultrasound.  No evidence for ovarian torsion. 2. The right ovary is still seen. The patient's prior surgery may have simply reflected removal of a right-sided ovarian cyst. Electronically Signed   By: Roanna RaiderJeffery  Chang M.D.   On: 09/16/2015 16:53   [place] Art/ven Flow Abd Pelv Doppler  09/16/2015  CLINICAL DATA:  Acute onset of left lower quadrant abdominal pain and vaginal bleeding. Initial encounter. EXAM: TRANSABDOMINAL AND TRANSVAGINAL ULTRASOUND OF PELVIS DOPPLER ULTRASOUND OF OVARIES TECHNIQUE:  Both transabdominal and transvaginal ultrasound examinations of the pelvis were performed. Transabdominal technique was performed for global imaging of the pelvis including uterus, ovaries, adnexal regions, and pelvic cul-de-sac. It was necessary to proceed with endovaginal exam following the transabdominal exam to visualize the left ovary. Color and duplex Doppler ultrasound was utilized to evaluate blood flow to the ovaries.  COMPARISON:  None. FINDINGS: Uterus Measurements: 9.4 x 4.2 x 5.9 cm. No fibroids or other mass visualized. A nabothian cyst is noted at the cervix. Endometrium Thickness: 0.3 cm.  No focal abnormality visualized. Right ovary Measurements: 2.7 x 2.1 x 2.0 cm. Normal appearance/no adnexal mass. Left ovary Measurements: 4.5 x 3.3 x 3.9 cm. Normal appearance/no adnexal mass. Pulsed Doppler evaluation of both ovaries demonstrates normal low-resistance arterial and venous waveforms. Other findings No abnormal Simpson fluid. IMPRESSION: 1. Unremarkable pelvic ultrasound.  No evidence for ovarian torsion. 2. The right ovary is still seen. The patient's prior surgery may have simply reflected removal of a right-sided ovarian cyst. Electronically Signed   By: Roanna Raider M.D.   On: 09/16/2015 16:53   I have personally reviewed and evaluated these images and lab results as part of my medical decision-making.   EKG Interpretation None      MDM   Final diagnoses:  Pelvic pain in female  Vaginal bleeding    Abnormal uterine bleeding. Likely hormonal. Exam without any evidence of trauma. Ultrasound negative for any abnormalities. Will follow with her gynecologist tomorrow.  New Prescriptions: Discharge Medication List as of 09/16/2015  5:26 PM    START taking these medications   Details  HYDROcodone-acetaminophen (NORCO/VICODIN) 5-325 MG tablet Take 1 tablet by mouth every 4 (four) hours as needed., Starting 09/16/2015, Until Discontinued, Print         I have personally and contemperaneously reviewed labs and imaging and used in my decision making as above.   A medical screening exam was performed and I feel the patient has had an appropriate workup for their chief complaint at this time and likelihood of emergent condition existing is low and thus workup can continue on an outpatient basis.. Their vital signs are stable. They have been counseled on decision, discharge, follow up and which symptoms  necessitate immediate return to the emergency department.  They verbally stated understanding and agreement with plan and discharged in stable condition.    I personally performed the services described in this documentation, which was scribed in my presence. The recorded information has been reviewed and is accurate.    Marily Memos, MD 09/16/15 2108

## 2017-04-16 ENCOUNTER — Telehealth: Payer: Self-pay | Admitting: Internal Medicine

## 2017-12-18 NOTE — Telephone Encounter (Signed)
error 

## 2020-08-29 ENCOUNTER — Ambulatory Visit: Payer: BLUE CROSS/BLUE SHIELD | Admitting: Medical

## 2021-06-18 ENCOUNTER — Other Ambulatory Visit: Payer: Self-pay

## 2021-06-18 ENCOUNTER — Emergency Department (HOSPITAL_BASED_OUTPATIENT_CLINIC_OR_DEPARTMENT_OTHER): Payer: Medicaid Other

## 2021-06-18 ENCOUNTER — Encounter (HOSPITAL_BASED_OUTPATIENT_CLINIC_OR_DEPARTMENT_OTHER): Payer: Self-pay | Admitting: Emergency Medicine

## 2021-06-18 ENCOUNTER — Emergency Department (HOSPITAL_BASED_OUTPATIENT_CLINIC_OR_DEPARTMENT_OTHER)
Admission: EM | Admit: 2021-06-18 | Discharge: 2021-06-18 | Disposition: A | Discharge status: Home / Self Care | Payer: Medicaid Other | Attending: Emergency Medicine | Admitting: Emergency Medicine

## 2021-06-18 DIAGNOSIS — M25511 Pain in right shoulder: Secondary | ICD-10-CM

## 2021-06-18 NOTE — ED Notes (Signed)
Pt verbalized understanding of MSE waiver. Signature pad not working.  ?

## 2021-06-18 NOTE — ED Provider Notes (Signed)
?[facility] ?Provider Note ? ? ?CSN: [id number] ?Arrival date & time: 06/18/21  1020 ? ?  ? ?History ? ?Chief Complaint  ?Patient presents with  ? Shoulder Pain  ? ? ?Abigail Simpson is a 31 y.o. female. ? ?Patient with no pertinent past medical history presents today with complaints of right shoulder pain.  She states that same has been bothering her intermittently for the past 8 months.  No specific injury noted, however she does state that she had some shoulder injuries in her childhood from racing motocross.  She also states that she works at a [employer], [employer] and regularly lifts heavy cans of paint.  She states that this flare of her symptoms has lasted for the past several days.  Pain is isolated to the right shoulder joint and does not radiate.  She denies any sensation changes in her right hand or arm or any sharp shooting pain down her arm.  She states that she has not tried anything for symptoms as she does not believe in medications.  She denies any fevers, chills, chest pain, shortness of breath, nausea, vomiting, diarrhea. ? ?The history is provided by the patient. No language interpreter was used.  ?Shoulder Pain ?Associated symptoms: no back pain, no fever and no neck pain   ? ?  ? ?Home Medications ?Prior to Admission medications   ?Medication Sig Start Date End Date Taking? Authorizing Provider  ?ALPRAZolam (XANAX) 1 MG tablet Take 1 mg by mouth at bedtime as needed for anxiety.    [provider]  ?HYDROcodone-acetaminophen (NORCO/VICODIN) 5-325 MG tablet Take 1 tablet by mouth every 4 (four) hours as needed. 09/16/15   Mesner, Corene Cornea, MD  ?meloxicam (MOBIC) 15 MG tablet Take 1 tablet (15 mg total) by mouth daily. ?Patient not taking: Reported on 05/09/2015 04/30/15   Dene Gentry, MD  ?traMADol (ULTRAM) 50 MG tablet Take 1 tablet (50 mg total) by mouth every 6 (six) hours as needed. 04/25/15   Dene Gentry, MD  ?   ? ?Allergies    ?Patient has no known allergies.    ? ?Review of Systems   ?Review of Systems  ?Constitutional:  Negative for chills and fever.  ?Musculoskeletal:  Positive for arthralgias and myalgias. Negative for back pain, joint swelling, neck pain and neck stiffness.  ?All other systems reviewed and are negative. ? ?Physical Exam ?Updated Vital Signs ?BP (!) 149/95 (BP Location: Left Arm)   Pulse 85   Temp 98.1 ?F (36.7 ?C) (Oral)   Resp 18   Ht 5\' 5"  (1.651 m)   Wt 90.7 kg   LMP 06/12/2021   SpO2 98%   BMI 33.28 kg/m?  ?Physical Exam ?Vitals and nursing note reviewed.  ?Constitutional:   ?   General: She is not in acute distress. ?   Appearance: Normal appearance. She is normal weight. She is not ill-appearing, toxic-appearing or diaphoretic.  ?HENT:  ?   Head: Normocephalic and atraumatic.  ?Cardiovascular:  ?   Rate and Rhythm: Normal rate.  ?Pulmonary:  ?   Effort: Pulmonary effort is normal. No respiratory distress.  ?Musculoskeletal:     ?   General: Normal range of motion.  ?   Cervical back: Normal range of motion.  ?   Comments: Tenderness to palpation of right humeral head.  Some popping and clicking noted with ROM, however passive and active ROM intact to the shoulder. 5/5 strength noted.  Radial pulses intact 2+.  Capillary refill less  than 2 seconds.  No obvious swelling, bruising, erythema, warmth, or deformity noted  ?Skin: ?   General: Skin is warm and dry.  ?Neurological:  ?   General: No focal deficit present.  ?   Mental Status: She is alert.  ?Psychiatric:     ?   Mood and Affect: Mood normal.     ?   Behavior: Behavior normal.  ? ? ?ED Results / Procedures / Treatments   ?Labs ?(all labs ordered are listed, but only abnormal results are displayed) ?Labs Reviewed - No data to display ? ?EKG ?None ? ?Radiology ?DG Shoulder Right ? ?Result Date: 06/18/2021 ?CLINICAL DATA:  Pain right shoulder EXAM: RIGHT SHOULDER - 2+ VIEW COMPARISON:  None. FINDINGS: There is no evidence of fracture or dislocation. There is no evidence of arthropathy  or other focal bone abnormality. Soft tissues are unremarkable. IMPRESSION: No radiographic abnormality is seen in the right shoulder. Electronically Signed   By: Elmer Picker M.D.   On: 06/18/2021 10:55   ? ?Procedures ?Procedures  ? ? ?Medications Ordered in ED ?Medications - No data to display ? ?ED Course/ Medical Decision Making/ A&P ?  ?                        ?Medical Decision Making ?Amount and/or Complexity of Data Reviewed ?Radiology: ordered. ? ? ?Patient presents today with 8 months of intermittent right shoulder pain.  She is afebrile, nontoxic-appearing, and in no acute distress with reassuring vital signs.  Patient X-Ray negative for obvious fracture or dislocation.  I have personally reviewed these x-rays and agree with radiology interpretation. ? ? ?Offered pain control medications which patient declined, stating she does not believe in medications.  Will give orthopedic referral for further evaluation and management of her symptoms.  Recommended conservative therapy including RICE and passive stretching.  Thoroughly discussed same with patient who expressed understanding.  She is amenable with plan, educated on red flag symptoms of prompt immediate return.  Discharged in stable condition.  ? ? ?Final Clinical Impression(s) / ED Diagnoses ?Final diagnoses:  ?Acute pain of right shoulder  ? ? ?Rx / DC Orders ?ED Discharge Orders   ? ? None  ? ?  ?An After Visit Summary was printed and given to the patient. ? ? ?  ?Bud Face, PA-C ?06/18/21 1330 ? ?  ?Lorelle Gibbs, DO ?06/18/21 1549 ? ?

## 2021-06-18 NOTE — ED Triage Notes (Signed)
R shoulder pain x 8 months. Pain has been intermittent but recently every day and more painful ?

## 2021-06-18 NOTE — Discharge Instructions (Addendum)
As we discussed, your work-up in the ER was reassuring for acute abnormalities.  I have given you a referral to orthopedics for further evaluation and management of your symptoms.  Please call them and schedule an appointment.  I also attached some shoulder exercises that you can do for additional management of your pain.  Please do these as you are able.  I also recommend rest, ice, and elevation of areas of pain. ? ?Return if development of any new or worsening symptoms. ?

## 2022-12-13 ENCOUNTER — Emergency Department (HOSPITAL_BASED_OUTPATIENT_CLINIC_OR_DEPARTMENT_OTHER): Payer: Medicaid Other

## 2022-12-13 ENCOUNTER — Emergency Department (HOSPITAL_BASED_OUTPATIENT_CLINIC_OR_DEPARTMENT_OTHER)
Admission: EM | Admit: 2022-12-13 | Discharge: 2022-12-13 | Disposition: A | Discharge status: Home / Self Care | Payer: Medicaid Other | Attending: Emergency Medicine | Admitting: Emergency Medicine

## 2022-12-13 ENCOUNTER — Other Ambulatory Visit: Payer: Self-pay

## 2022-12-13 ENCOUNTER — Encounter (HOSPITAL_BASED_OUTPATIENT_CLINIC_OR_DEPARTMENT_OTHER): Payer: Self-pay

## 2022-12-13 DIAGNOSIS — R102 Pelvic and perineal pain: Secondary | ICD-10-CM | POA: Insufficient documentation

## 2022-12-13 DIAGNOSIS — N939 Abnormal uterine and vaginal bleeding, unspecified: Secondary | ICD-10-CM | POA: Insufficient documentation

## 2022-12-13 DIAGNOSIS — R9389 Abnormal findings on diagnostic imaging of other specified body structures: Secondary | ICD-10-CM | POA: Diagnosis not present

## 2022-12-13 LAB — CBC
HCT: 31.6 % — ABNORMAL LOW (ref 36.0–46.0)
Hemoglobin: 11 g/dL — ABNORMAL LOW (ref 12.0–15.0)
MCH: 29.8 pg (ref 26.0–34.0)
MCHC: 34.8 g/dL (ref 30.0–36.0)
MCV: 85.6 fL (ref 80.0–100.0)
Platelets: 275 10*3/uL (ref 150–400)
RBC: 3.69 MIL/uL — ABNORMAL LOW (ref 3.87–5.11)
RDW: 12.1 % (ref 11.5–15.5)
WBC: 6.8 10*3/uL (ref 4.0–10.5)
nRBC: 0 % (ref 0.0–0.2)

## 2022-12-13 LAB — PREGNANCY, URINE: Preg Test, Ur: NEGATIVE

## 2022-12-13 NOTE — Discharge Instructions (Signed)
Your workup for emergent causes of bleeding is negative today. Your ultrasound did says some abnormal thickening of the endometrium which is the inner lining of your uterus. This will be evaluated by your gynecologist this week when you see her. You also have a small cyst on your left ovary which is a normal physiologic finding. It appears that it was your right ovary that was removed previously. Get help right away if you lose consciousness become short of breath or dizzy whenever you stand or are fully saturating 1 overnight pad within an hour repeatedly.

## 2022-12-13 NOTE — ED Notes (Signed)
Patient transported to Ultrasound 

## 2022-12-13 NOTE — ED Triage Notes (Signed)
The patient stated she missed a period last month. She stated then 9 days ago she started to have heavy vaginal bleeding. No pain at this time.

## 2022-12-13 NOTE — ED Provider Notes (Signed)
[facility] Provider Note   CSN: [id number] Arrival date & time: 12/13/22  1143     History  Chief Complaint  Patient presents with   Vaginal Bleeding    Abigail Simpson is a 32 y.o. female who presents emergency department with a chief complaint of vaginal bleeding.  She has a past medical history of generalized anxiety disorder and status post oophorectomy.  Patient is unsure which side this was.  Patient reports that she missed a period which is extremely abnormal for her and then began bleeding 9 days ago.  Her vaginal bleeding has been abnormal as she had 2 days of light spotting followed by heavy bleeding and passage of clots for the past 7 days.  She has never had anything like this before.  Patient reports that she is married but has not been having sex with her husband because she does not want to get pregnant and he is scheduled for a vasectomy in November.  Patient does not take any oral contraceptive pills and only takes vitamin C daily and occasional Tylenol.  She reached out to her OB/GYN because she has noticed passage of clots.  She states her bleeding is worse when she wears a tampon and when she does she feels up a tampon and a pad every 45 minutes however when she is not wearing a tampon it takes much longer for her to fill that.  The history is provided by the patient and medical records. No language interpreter was used.  Vaginal Bleeding Quality:  Bright red, dark red, heavier than menses and clots Severity:  Moderate Onset quality:  Gradual Duration:  9 days Timing:  Constant Progression:  Unchanged Chronicity:  New Menstrual history:  Regular Possible pregnancy: no   Relieved by:  Nothing Worsened by:  Nothing Ineffective treatments:  None tried Associated symptoms: no abdominal pain, no back pain, no dizziness, no dyspareunia, no dysuria, no fatigue, no fever, no nausea and no vaginal discharge        Home  Medications Prior to Admission medications   Medication Sig Start Date End Date Taking? Authorizing Provider  ALPRAZolam Prudy Feeler) 1 MG tablet Take 1 mg by mouth at bedtime as needed for anxiety.    [provider]  HYDROcodone-acetaminophen (NORCO/VICODIN) 5-325 MG tablet Take 1 tablet by mouth every 4 (four) hours as needed. 09/16/15   Mesner, Barbara Cower, MD  meloxicam (MOBIC) 15 MG tablet Take 1 tablet (15 mg total) by mouth daily. Patient not taking: Reported on 05/09/2015 04/30/15   Lenda Kelp, MD  traMADol (ULTRAM) 50 MG tablet Take 1 tablet (50 mg total) by mouth every 6 (six) hours as needed. 04/25/15   Lenda Kelp, MD      Allergies    Patient has no known allergies.    Review of Systems   Review of Systems  Constitutional:  Negative for fatigue and fever.  Gastrointestinal:  Negative for abdominal pain and nausea.  Genitourinary:  Positive for vaginal bleeding. Negative for dyspareunia, dysuria and vaginal discharge.  Musculoskeletal:  Negative for back pain.  Neurological:  Negative for dizziness.    Physical Exam Updated Vital Signs BP (!) 148/89 (BP Location: Left Arm)   Pulse (!) 107   Temp 98.3 F (36.8 C)   Resp 16   Ht 5\' 5"  (1.651 m)   Wt 81.6 kg   SpO2 100%   BMI 29.95 kg/m  Physical Exam Vitals and nursing note reviewed.  Constitutional:      General: She is not in acute distress.    Appearance: She is well-developed. She is not diaphoretic.  HENT:     Head: Normocephalic and atraumatic.     Right Ear: External ear normal.     Left Ear: External ear normal.     Nose: Nose normal.     Mouth/Throat:     Mouth: Mucous membranes are moist.  Eyes:     General: No scleral icterus.    Conjunctiva/sclera: Conjunctivae normal.  Cardiovascular:     Rate and Rhythm: Normal rate and regular rhythm.     Heart sounds: Normal heart sounds. No murmur heard.    No friction rub. No gallop.  Pulmonary:     Effort: Pulmonary effort is normal. No  respiratory distress.     Breath sounds: Normal breath sounds.  Abdominal:     General: Bowel sounds are normal. There is no distension.     Palpations: Abdomen is soft. There is no mass.     Tenderness: There is no abdominal tenderness. There is no guarding.  Musculoskeletal:     Cervical back: Normal range of motion.  Skin:    General: Skin is warm and dry.  Neurological:     Mental Status: She is alert and oriented to person, place, and time.  Psychiatric:        Behavior: Behavior normal.    ED Results / Procedures / Treatments   Labs (all labs ordered are listed, but only abnormal results are displayed) Labs Reviewed  CBC - Abnormal; Notable for the following components:      Result Value   RBC 3.69 (*)    Hemoglobin 11.0 (*)    HCT 31.6 (*)    All other components within normal limits  PREGNANCY, URINE    EKG None  Radiology No results found.  Procedures Procedures    Medications Ordered in ED Medications - No data to display  ED Course/ Medical Decision Making/ A&P                                 Medical Decision Making This patient presents to the ED for concern of vaginal bleeding, this involves an extensive number of treatment options, and is a complaint that carries with it a high risk of complications and morbidity.  Differential diagnosis for nonpregnant vaginal bleeding includes but is not limited to systemic causes such as cirrhosis of the liver, coagulopathy such as ITP or von Willebrand's disease, strep vaginitis, HRT, hypothyroidism, secondary anovulation.  Reproductive tract causes include adenomyosis, atrophic endometrium, dysfunctional uterine bleeding, endometriosis, fibroids, foreign body, infection, IUD, neoplasia or vaginal trauma.    Co morbidities:       has no past medical history on file.   Social Determinants of Health:       SDOH Screenings Tobacco Use: Unknown (12/13/2022) Recent Concern: Tobacco Use - High Risk (10/13/2022)      Received from [facility]   Additional history:  {Additional history obtained from emr   Lab Tests:  I Ordered, and personally interpreted labs.  The pertinent results include:   Minmial anemia   Imaging Studies:  I ordered imaging studies including US pelvis I independently visualized and interpreted imaging which showed endometrial thickening, physiologic ovarian cyst I agree with the radiologist interpretation     Test Considered:       I considered doing a pelvic  exam and collecting samples however patient has a follow-up visit with her gynecologist in 4 days and will defer to her GYN  Critical Interventions:         Consultations Obtained:   Problem List / ED Course:       (N93.9) Abnormal uterine bleeding  (primary encounter diagnosis)  (R93.89) Endometrial thickening on ultrasound   MDM: Patient here with vaginal bleeding.  No pregnancy.  Thickened endometrial strip.  Will need follow-up.  She is not having active hemorrhage, she does not have any weakness lightheadedness or shortness of breath.  Vital signs are stable.  Will have her follow-up in the outpatient setting.  Discussed return precautions   Dispostion:  After consideration of the diagnostic results and the patients response to treatment, I feel that the patent would benefit from discharge.    Amount and/or Complexity of Data Reviewed Labs: ordered. Radiology: ordered.           Final Clinical Impression(s) / ED Diagnoses Final diagnoses:  None    Rx / DC Orders ED Discharge Orders     None         Arthor Captain, PA-C 12/13/22 2321    Terrilee Files, MD 12/14/22 1020

## 2023-10-21 IMAGING — DX DG SHOULDER 2+V*R*
4 series · 4 of 4 positions shown · non-contrast
Comparison: None.

CLINICAL DATA: Pain right shoulder

EXAM:
RIGHT SHOULDER - 2+ VIEW

[shoulder grashey]
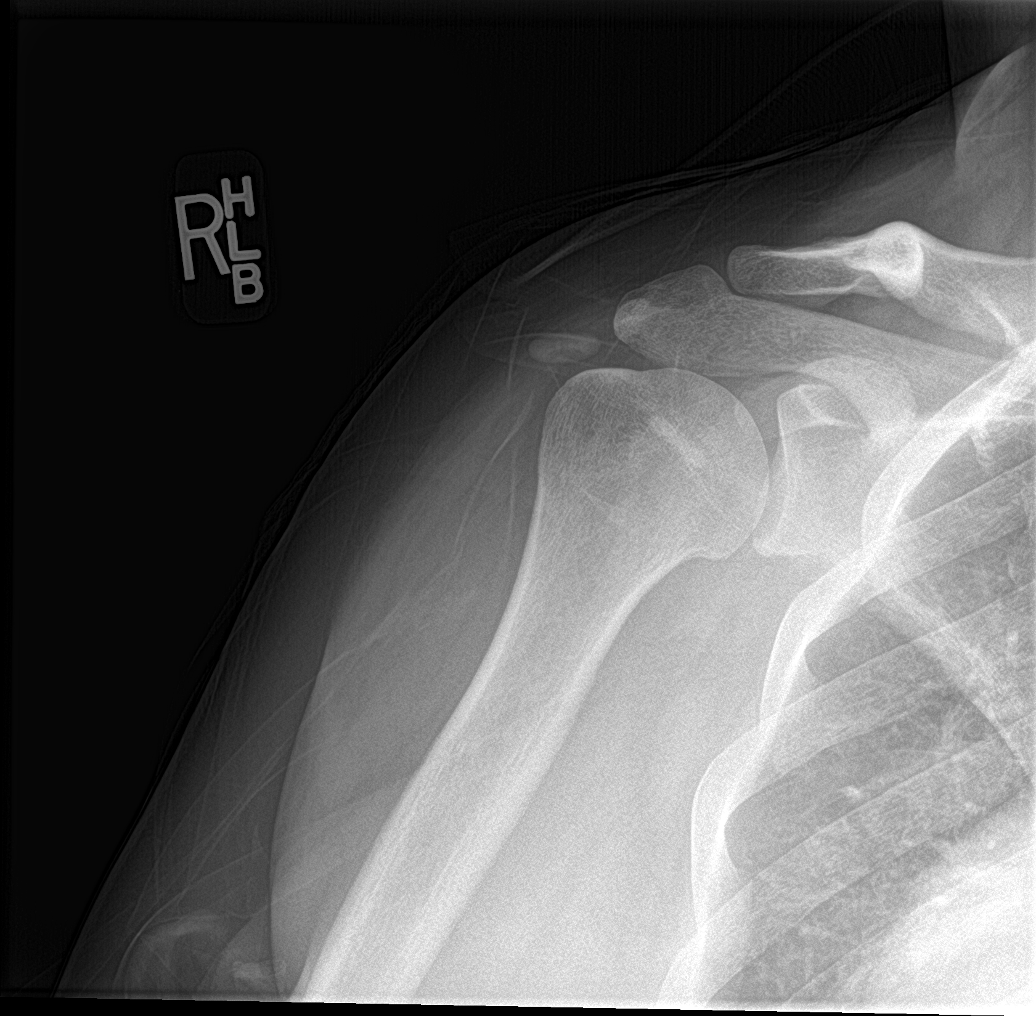

[shoulder y view]
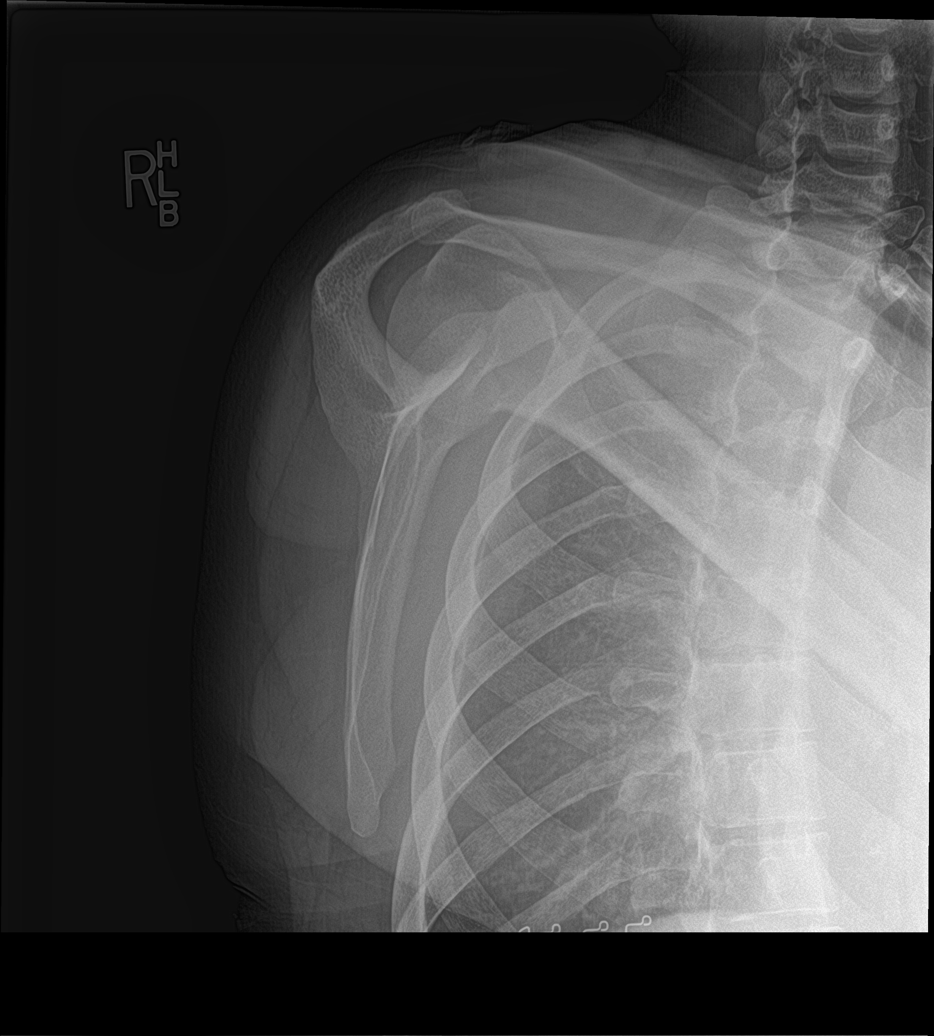

[shoulder axillary]
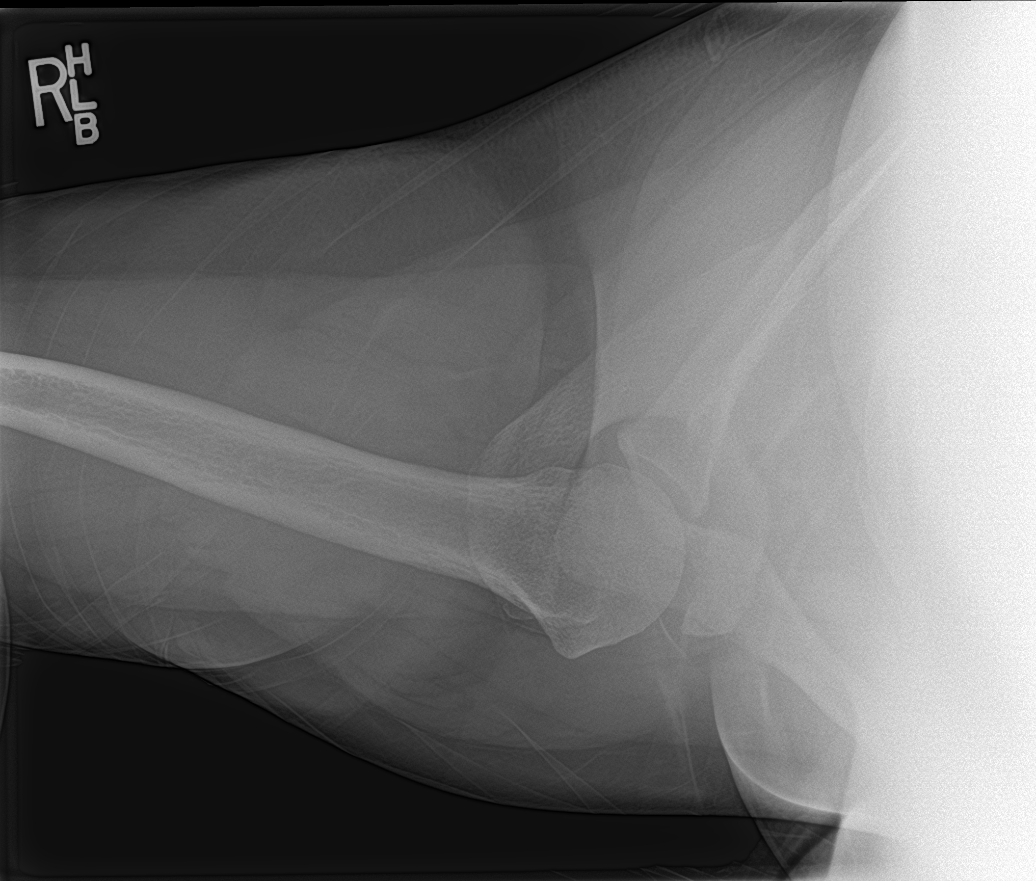

[shoulder ap neutral]
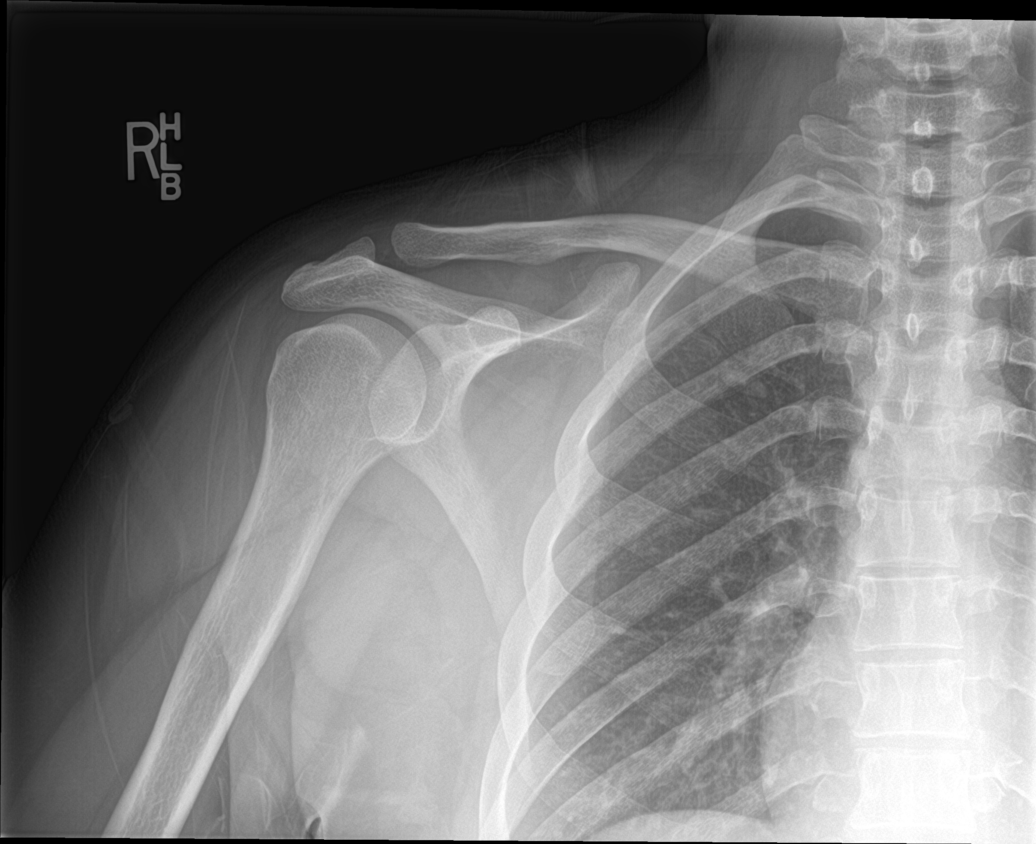

[4 of 4 positions shown; findings below may reference images not displayed]

FINDINGS: There is no evidence of fracture or dislocation. There is no
evidence of arthropathy or other focal bone abnormality. Soft
tissues are unremarkable.
IMPRESSION: No radiographic abnormality is seen in the right shoulder.

## 2024-02-18 ENCOUNTER — Encounter (HOSPITAL_BASED_OUTPATIENT_CLINIC_OR_DEPARTMENT_OTHER): Payer: Self-pay

## 2024-02-18 ENCOUNTER — Emergency Department (HOSPITAL_BASED_OUTPATIENT_CLINIC_OR_DEPARTMENT_OTHER)
Admission: EM | Admit: 2024-02-18 | Discharge: 2024-02-18 | Disposition: A | Discharge status: Home / Self Care | Attending: Emergency Medicine | Admitting: Emergency Medicine

## 2024-02-18 ENCOUNTER — Other Ambulatory Visit: Payer: Self-pay

## 2024-02-18 DIAGNOSIS — I493 Ventricular premature depolarization: Secondary | ICD-10-CM | POA: Diagnosis not present

## 2024-02-18 DIAGNOSIS — R002 Palpitations: Secondary | ICD-10-CM | POA: Diagnosis present

## 2024-02-18 DIAGNOSIS — I10 Essential (primary) hypertension: Secondary | ICD-10-CM | POA: Insufficient documentation

## 2024-02-18 LAB — URINALYSIS, W/ REFLEX TO CULTURE (INFECTION SUSPECTED)
Bilirubin Urine: NEGATIVE
Glucose, UA: NEGATIVE mg/dL
Hgb urine dipstick: NEGATIVE
Ketones, ur: NEGATIVE mg/dL
Nitrite: NEGATIVE
Protein, ur: NEGATIVE mg/dL
Specific Gravity, Urine: 1.01 (ref 1.005–1.030)
pH: 6 (ref 5.0–8.0)

## 2024-02-18 LAB — CBC
HCT: 38.3 % (ref 36.0–46.0)
Hemoglobin: 13.3 g/dL (ref 12.0–15.0)
MCH: 28.9 pg (ref 26.0–34.0)
MCHC: 34.7 g/dL (ref 30.0–36.0)
MCV: 83.1 fL (ref 80.0–100.0)
Platelets: 274 K/uL (ref 150–400)
RBC: 4.61 MIL/uL (ref 3.87–5.11)
RDW: 12.3 % (ref 11.5–15.5)
WBC: 7.8 K/uL (ref 4.0–10.5)
nRBC: 0 % (ref 0.0–0.2)

## 2024-02-18 LAB — D-DIMER, QUANTITATIVE: D-Dimer, Quant: <0.27 ug{FEU}/mL (ref 0.00–0.50)

## 2024-02-18 LAB — BASIC METABOLIC PANEL WITH GFR
Anion gap: 13 (ref 5–15)
BUN: 10 mg/dL (ref 6–20)
CO2: 22 mmol/L (ref 22–32)
Calcium: 9.3 mg/dL (ref 8.9–10.3)
Chloride: 102 mmol/L (ref 98–111)
Creatinine, Ser: 0.72 mg/dL (ref 0.44–1.00)
GFR, Estimated: >60 mL/min (ref >60)
Glucose, Bld: 103 mg/dL — ABNORMAL HIGH (ref 70–99)
Potassium: 3.9 mmol/L (ref 3.5–5.1)
Sodium: 138 mmol/L (ref 135–145)

## 2024-02-18 LAB — TSH: TSH: 3.25 u[IU]/mL (ref 0.350–4.500)

## 2024-02-18 LAB — PREGNANCY, URINE: Preg Test, Ur: NEGATIVE

## 2024-02-18 LAB — T4, FREE: Free T4: 0.76 ng/dL (ref 0.61–1.12)

## 2024-02-18 LAB — TROPONIN T, HIGH SENSITIVITY
Troponin T High Sensitivity: <15 ng/L (ref 0–19)
Troponin T High Sensitivity: <15 ng/L (ref 0–19)

## 2024-02-18 MED ORDER — CEPHALEXIN 500 MG PO CAPS: 500.0000 mg | ORAL_CAPSULE | Freq: Three times a day (TID) | ORAL | 0 refills | Status: AC

## 2024-02-18 NOTE — ED Provider Notes (Signed)
 [place] [facility] Provider Note   CSN: [id number] Arrival date & time: 02/18/24  1147     Patient presents with: Palpitations   Abigail Simpson is a 33 y.o. female.   HPI    33yo female with history of hypertension presents with concern for palpitations.  Saturday or Sunday started No other symptoms When it happens feels like like a heart drop  Having a week now of feeling palpitations Home life so stressed out, working constantly, kids fighting No chest pain or dyspnea, no fevers, cough urinary symptoms, nausea or vomiting or diarrhea Leg has pain from dislocated in the past chronically   Is feeling PVCs  Psoriasis, hypertension No fam hx of early heart disease  Sister has POTS  Vape, no etoh or other drugs   Past Medical History:  Diagnosis Date   Hypertension      Prior to Admission medications  Medication Sig Start Date End Date Taking? Authorizing Provider  cephALEXin (KEFLEX) 500 MG capsule Take 1 capsule (500 mg total) by mouth 3 (three) times daily for 7 days. 02/18/24 02/25/24 Yes Dreama Longs, MD  ALPRAZolam  (XANAX ) 1 MG tablet Take 1 mg by mouth at bedtime as needed for anxiety.    [provider]  HYDROcodone -acetaminophen  (NORCO/VICODIN) 5-325 MG tablet Take 1 tablet by mouth every 4 (four) hours as needed. 09/16/15   Mesner, Selinda, MD  meloxicam  (MOBIC ) 15 MG tablet Take 1 tablet (15 mg total) by mouth daily. Patient not taking: Reported on 05/09/2015 04/30/15   Cleatrice Ludie SAUNDERS, MD  traMADol  (ULTRAM ) 50 MG tablet Take 1 tablet (50 mg total) by mouth every 6 (six) hours as needed. 04/25/15   Cleatrice Ludie SAUNDERS, MD    Allergies: Patient has no known allergies.    Review of Systems  Updated Vital Signs BP (!) 150/73   Pulse (!) 126   Temp 98.4 F (36.9 C) (Oral)   Resp 20   Ht 5' 5 (1.651 m)   Wt 90.7 kg   LMP 01/21/2024 (Approximate)   SpO2 100%   BMI 33.28 kg/m   Physical Exam Vitals and  nursing note reviewed.  Constitutional:      General: She is not in acute distress.    Appearance: She is well-developed. She is not diaphoretic.  HENT:     Head: Normocephalic and atraumatic.  Eyes:     Conjunctiva/sclera: Conjunctivae normal.  Cardiovascular:     Rate and Rhythm: Regular rhythm. Tachycardia present.     Pulses: Normal pulses.     Heart sounds: Normal heart sounds. No murmur heard.    No friction rub. No gallop.  Pulmonary:     Effort: Pulmonary effort is normal. No respiratory distress.     Breath sounds: Normal breath sounds. No wheezing or rales.  Abdominal:     General: There is no distension.     Palpations: Abdomen is soft.     Tenderness: There is no abdominal tenderness. There is no guarding.  Musculoskeletal:        General: No tenderness.     Cervical back: Normal range of motion.  Skin:    General: Skin is warm and dry.     Findings: No erythema or rash.  Neurological:     Mental Status: She is alert and oriented to person, place, and time.     (all labs ordered are listed, but only abnormal results are displayed) Labs Reviewed  BASIC METABOLIC PANEL WITH GFR -  Abnormal; Notable for the following components:      Result Value   Glucose, Bld 103 (*)    All other components within normal limits  URINALYSIS, W/ REFLEX TO CULTURE (INFECTION SUSPECTED) - Abnormal; Notable for the following components:   Leukocytes,Ua TRACE (*)    Bacteria, UA FEW (*)    All other components within normal limits  URINE CULTURE  CBC  PREGNANCY, URINE  TSH  T4, FREE  D-DIMER, QUANTITATIVE  TROPONIN T, HIGH SENSITIVITY  TROPONIN T, HIGH SENSITIVITY    EKG: EKG Interpretation Date/Time:  Thursday February 18 2024 11:57:04 EST Ventricular Rate:  129 PR Interval:  133 QRS Duration:  89 QT Interval:  305 QTC Calculation: 447 R Axis:   66  Text Interpretation: Sinus tachycardia Inferior infarct, old Anterolateral Q wave, probably normal for age No previous  ECGs available Confirmed by Dreama Longs ([id number]) on 02/18/2024 12:00:01 PM  Radiology: No results found.   Procedures   Medications Ordered in the ED - No data to display                                   33yo female with history of hypertension presents with concern for palpitations.  Arrives to the ED with tachycardia, having PVCs.  Ddx includes arrhythmia, dehydration, anemia, anxiety, medication/drug effect or withdrawal, PE, ACS, myocarditis, infection, hyperthyroidism.    Labs completed and evaluated by me show negative troponin (low suspicion for MI/myocarditis), no significant electrolyte abnormalities, no anemia or leukocytosis. DDimer negative, low risk Wells, doubt PE.  TSH/free T4 WNL.   HR improved while in the ED.  UA is borderline for infection-will cover with keflex and send for culture. Do not suspect sepsis at this time.    She is certainly feeling PVCs while in the ED.  Will refer to Cardiology for evaluation in setting of palpitations. Patient discharged in stable condition with understanding of reasons to return.      Final diagnoses:  Palpitations  PVC's (premature ventricular contractions)    ED Discharge Orders          Ordered    Ambulatory referral to Cardiology        02/18/24 1451    cephALEXin (KEFLEX) 500 MG capsule  3 times daily        02/18/24 1455               Dreama Longs, MD 02/18/24 2207

## 2024-02-18 NOTE — ED Triage Notes (Signed)
 Pt states that she has been having some heart palpitations since Saturday. Pt denies pain. Pt denies shortness of breath. Pt verbalizes stress. Pt states that she has history of HTN that has since resolved.

## 2024-02-19 LAB — URINE CULTURE: Culture: <10000 — AB

## 2024-04-02 NOTE — Progress Notes (Unsigned)
" ° °  Cardiology Office Note:    Date:  04/02/2024   ID:  Abigail Simpson, DOB 1990/09/06, MRN [id number]  PCP:  Berkeley Nest, FNP  Cardiologist:  None { Click to update primary MD,subspecialty MD or APP then REFRESH:1}    Referring MD: Dreama Longs, MD   Chief Complaint: palpitations  History of Present Illness:    Abigail Simpson is a 34 y.o. female with a history of hypertension and anxiety who presents today as a new patient in the [facility] for further evaluation of palpitations.   Patient was seen in the ED on 02/18/2024 for further evaluation of palpitations. EKG showed sinus tachycardia, rate 129 bpm, with Q waves in inferior leads and leads V4-V6 but no acute ischemic changes. High-sensitivity troponin negative x2. D-dimer negative. TSH normal. Urinalysis showed trace leukocytosis and bacteria. Urine cultures showed insignificant growth. She was started on Keflex  and referred to Cardiology.   *** Palpitations ***  Hypertension ***  EKGs/Labs/Other Studies Reviewed:    The following studies were reviewed:  N/A  EKG:  EKG ordered today. EKG personally reviewed and demonstrates ***.  Recent Labs: 02/18/2024: BUN 10; Creatinine, Ser 0.72; Hemoglobin 13.3; Platelets 274; Potassium 3.9; Sodium 138; TSH 3.250  Recent Lipid Panel No results found for: CHOL, TRIG, HDL, CHOLHDL, VLDL, LDLCALC, LDLDIRECT  Physical Exam:    Vital Signs: There were no vitals taken for this visit.    Wt Readings from Last 3 Encounters:  02/18/24 200 lb (90.7 kg)  12/13/22 180 lb (81.6 kg)  06/18/21 200 lb (90.7 kg)     General: 34 y.o. female in no acute distress. HEENT: Normocephalic and atraumatic. Sclera clear.  Neck: Supple. No carotid bruits. No JVD. Heart: *** RRR. Distinct S1 and S2. No murmurs, gallops, or rubs.  Lungs: No increased work of breathing. Clear to ausculation bilaterally. No wheezes, rhonchi, or rales.  Abdomen: Soft, non-distended, and  non-tender to palpation.  Extremities: No lower extremity edema.  Radial and distal pedal pulses 2+ and equal bilaterally. Skin: Warm and dry. Neuro: No focal deficits. Psych: Normal affect. Responds appropriately.   Assessment:    No diagnosis found.  Plan:     Disposition: Follow up in ***   Signed, Aline FORBES Door, PA-C  04/02/2024 11:10 AM    [facility] "

## 2024-04-04 ENCOUNTER — Ambulatory Visit: Admitting: Physician Assistant

## 2024-04-14 ENCOUNTER — Ambulatory Visit: Admitting: Student

## 2024-05-09 ENCOUNTER — Ambulatory Visit: Admitting: General Practice
